# Patient Record
Sex: Female | Born: 1970 | Race: White | Hispanic: No | Marital: Married
Health system: Southern US, Community
[De-identification: ages and names within clinical notes are randomized; demographics above are authoritative.]

---

## 2014-01-29 ENCOUNTER — Other Ambulatory Visit: Payer: Self-pay | Admitting: Obstetrics and Gynecology

## 2014-01-29 DIAGNOSIS — N6001 Solitary cyst of right breast: Secondary | ICD-10-CM

## 2014-02-05 ENCOUNTER — Ambulatory Visit
Admission: RE | Admit: 2014-02-05 | Discharge: 2014-02-05 | Disposition: A | Discharge status: Home / Self Care | Payer: BC Managed Care – PPO | Source: Ambulatory Visit | Attending: Obstetrics and Gynecology | Admitting: Obstetrics and Gynecology

## 2014-02-05 ENCOUNTER — Encounter (INDEPENDENT_AMBULATORY_CARE_PROVIDER_SITE_OTHER): Payer: Self-pay

## 2014-02-05 DIAGNOSIS — N6001 Solitary cyst of right breast: Secondary | ICD-10-CM

## 2015-05-27 ENCOUNTER — Ambulatory Visit
Admission: RE | Admit: 2015-05-27 | Discharge: 2015-05-27 | Disposition: A | Discharge status: Home / Self Care | Payer: BLUE CROSS/BLUE SHIELD | Source: Ambulatory Visit | Attending: Family Medicine | Admitting: Family Medicine

## 2015-05-27 ENCOUNTER — Other Ambulatory Visit: Payer: Self-pay | Admitting: Family Medicine

## 2015-05-27 DIAGNOSIS — M5489 Other dorsalgia: Secondary | ICD-10-CM

## 2016-09-15 IMAGING — CR DG ABDOMEN 1V
1 series · 1 of 1 positions shown · non-contrast
Comparison: None.

CLINICAL DATA: 2 week history of left flank pain associated with
hematuria.

EXAM:
ABDOMEN - 1 VIEW

[view not recorded]
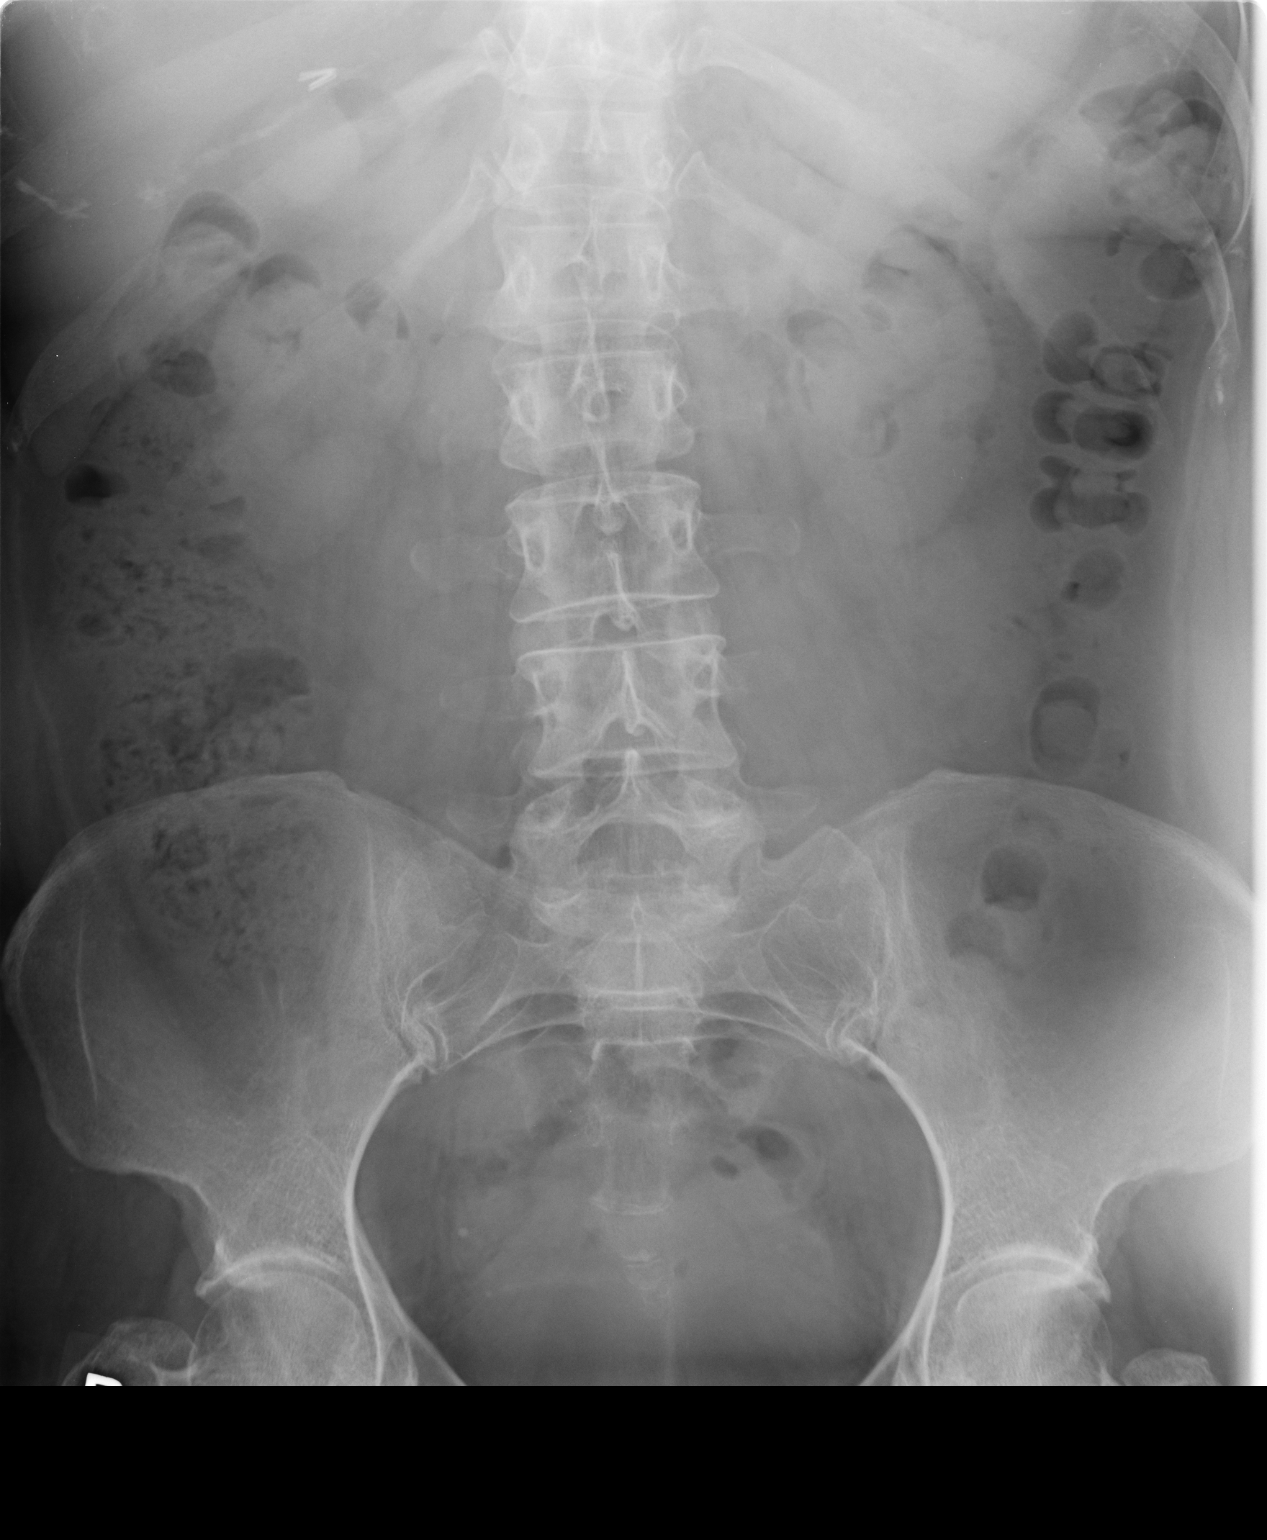

[1 of 1 positions shown; findings below may reference images not displayed]

FINDINGS: Bowel gas pattern unremarkable without evidence of obstruction or
significant ileus. Moderate stool burden in the colon. No visible
opaque urinary tract calculi. Phleboliths in both sides of the
pelvis. Surgical clips in right upper quadrant from prior
cholecystectomy. Regional skeleton intact.
IMPRESSION: No acute abdominal abnormality. No visible opaque urinary tract
calculi.
